# Patient Record
Sex: Female | Born: 1990 | Race: White | Hispanic: No | Marital: Single
Health system: Southern US, Community
[De-identification: ages and names within clinical notes are randomized; demographics above are authoritative.]

---
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
## 2015-10-30 ENCOUNTER — Emergency Department (HOSPITAL_COMMUNITY)
Admission: EM | Admit: 2015-10-30 | Discharge: 2015-10-30 | Discharge status: Against Medical Advice | Payer: Medicaid Other

## 2015-10-30 NOTE — ED Triage Notes (Addendum)
CHARTED ON WRONG PATIENT.
